# Patient Record
Sex: Male | Born: 1985 | Race: Black or African American | Hispanic: No | Marital: Single | Smoking: Light tobacco smoker
Health system: Southern US, Community
[De-identification: ages and names within clinical notes are randomized; demographics above are authoritative.]

---

## 2014-06-25 ENCOUNTER — Emergency Department (HOSPITAL_COMMUNITY): Payer: Self-pay | Admitting: Certified Registered Nurse Anesthetist

## 2014-06-25 ENCOUNTER — Observation Stay (HOSPITAL_COMMUNITY)
Admission: EM | Admit: 2014-06-25 | Discharge: 2014-06-26 | Disposition: A | Discharge status: Home / Self Care | Payer: Self-pay | Attending: Orthopedic Surgery | Admitting: Orthopedic Surgery

## 2014-06-25 ENCOUNTER — Encounter (HOSPITAL_COMMUNITY): Payer: Self-pay

## 2014-06-25 ENCOUNTER — Encounter (HOSPITAL_COMMUNITY)
Admission: EM | Disposition: A | Discharge status: Home / Self Care | Payer: Self-pay | Source: Home / Self Care | Attending: Emergency Medicine

## 2014-06-25 ENCOUNTER — Emergency Department (HOSPITAL_COMMUNITY): Payer: Self-pay

## 2014-06-25 DIAGNOSIS — S6990XA Unspecified injury of unspecified wrist, hand and finger(s), initial encounter: Secondary | ICD-10-CM | POA: Diagnosis present

## 2014-06-25 DIAGNOSIS — W503XXD Accidental bite by another person, subsequent encounter: Secondary | ICD-10-CM

## 2014-06-25 DIAGNOSIS — L089 Local infection of the skin and subcutaneous tissue, unspecified: Secondary | ICD-10-CM | POA: Diagnosis present

## 2014-06-25 DIAGNOSIS — S61452D Open bite of left hand, subsequent encounter: Secondary | ICD-10-CM

## 2014-06-25 DIAGNOSIS — S61452A Open bite of left hand, initial encounter: Principal | ICD-10-CM | POA: Insufficient documentation

## 2014-06-25 DIAGNOSIS — S6992XA Unspecified injury of left wrist, hand and finger(s), initial encounter: Secondary | ICD-10-CM

## 2014-06-25 DIAGNOSIS — Y929 Unspecified place or not applicable: Secondary | ICD-10-CM | POA: Insufficient documentation

## 2014-06-25 DIAGNOSIS — M009 Pyogenic arthritis, unspecified: Secondary | ICD-10-CM | POA: Insufficient documentation

## 2014-06-25 HISTORY — PX: I & D EXTREMITY: SHX5045

## 2014-06-25 LAB — CBC WITH DIFFERENTIAL/PLATELET
BASOS ABS: 0 10*3/uL (ref 0.0–0.1)
BASOS PCT: 0 % (ref 0–1)
EOS ABS: 0.5 10*3/uL (ref 0.0–0.7)
EOS PCT: 5 % (ref 0–5)
HEMATOCRIT: 42 % (ref 39.0–52.0)
HEMOGLOBIN: 14.2 g/dL (ref 13.0–17.0)
Lymphocytes Relative: 24 % (ref 12–46)
Lymphs Abs: 2.3 10*3/uL (ref 0.7–4.0)
MCH: 30.1 pg (ref 26.0–34.0)
MCHC: 33.8 g/dL (ref 30.0–36.0)
MCV: 89 fL (ref 78.0–100.0)
MONO ABS: 0.9 10*3/uL (ref 0.1–1.0)
MONOS PCT: 9 % (ref 3–12)
Neutro Abs: 6.1 10*3/uL (ref 1.7–7.7)
Neutrophils Relative %: 62 % (ref 43–77)
Platelets: 323 10*3/uL (ref 150–400)
RBC: 4.72 MIL/uL (ref 4.22–5.81)
RDW: 13.8 % (ref 11.5–15.5)
WBC: 9.8 10*3/uL (ref 4.0–10.5)

## 2014-06-25 LAB — I-STAT CHEM 8, ED
BUN: 10 mg/dL (ref 6–20)
CREATININE: 1.2 mg/dL (ref 0.61–1.24)
Calcium, Ion: 1.2 mmol/L (ref 1.12–1.23)
Chloride: 102 mmol/L (ref 101–111)
Glucose, Bld: 93 mg/dL (ref 70–99)
HCT: 48 % (ref 39.0–52.0)
HEMOGLOBIN: 16.3 g/dL (ref 13.0–17.0)
POTASSIUM: 3.9 mmol/L (ref 3.5–5.1)
SODIUM: 141 mmol/L (ref 135–145)
TCO2: 26 mmol/L (ref 0–100)

## 2014-06-25 LAB — SEDIMENTATION RATE: Sed Rate: 5 mm/hr (ref 0–16)

## 2014-06-25 LAB — C-REACTIVE PROTEIN: CRP: 1.2 mg/dL — ABNORMAL HIGH (ref <1.0)

## 2014-06-25 SURGERY — IRRIGATION AND DEBRIDEMENT EXTREMITY
Anesthesia: General | Site: Hand | Laterality: Left

## 2014-06-25 MED ORDER — HYDROCODONE-ACETAMINOPHEN 5-325 MG PO TABS
1.0000 | ORAL_TABLET | ORAL | Status: DC | PRN
  Administered 2014-06-25: 1 via ORAL
  Filled 2014-06-25: qty 1

## 2014-06-25 MED ORDER — PROMETHAZINE HCL 25 MG/ML IJ SOLN: 6.2500 mg | INTRAMUSCULAR | Status: DC | PRN

## 2014-06-25 MED ORDER — FENTANYL CITRATE (PF) 100 MCG/2ML IJ SOLN
25.0000 ug | INTRAMUSCULAR | Status: DC | PRN
  Administered 2014-06-25 (×2): 50 ug via INTRAVENOUS

## 2014-06-25 MED ORDER — MEPERIDINE HCL 50 MG/ML IJ SOLN
INTRAMUSCULAR | Status: AC
  Filled 2014-06-25: qty 1

## 2014-06-25 MED ORDER — PROPOFOL 10 MG/ML IV BOLUS
INTRAVENOUS | Status: AC
  Filled 2014-06-25: qty 20

## 2014-06-25 MED ORDER — DOCUSATE SODIUM 100 MG PO CAPS
100.0000 mg | ORAL_CAPSULE | Freq: Two times a day (BID) | ORAL | Status: DC
  Administered 2014-06-26 (×2): 100 mg via ORAL

## 2014-06-25 MED ORDER — FENTANYL CITRATE (PF) 100 MCG/2ML IJ SOLN
INTRAMUSCULAR | Status: AC
  Filled 2014-06-25: qty 2

## 2014-06-25 MED ORDER — 0.9 % SODIUM CHLORIDE (POUR BTL) OPTIME
TOPICAL | Status: DC | PRN
  Administered 2014-06-25: 1000 mL

## 2014-06-25 MED ORDER — KETOROLAC TROMETHAMINE 30 MG/ML IJ SOLN
INTRAMUSCULAR | Status: AC
  Filled 2014-06-25: qty 1

## 2014-06-25 MED ORDER — LIDOCAINE HCL (CARDIAC) 20 MG/ML IV SOLN
INTRAVENOUS | Status: DC | PRN
  Administered 2014-06-25: 100 mg via INTRAVENOUS

## 2014-06-25 MED ORDER — FENTANYL CITRATE (PF) 100 MCG/2ML IJ SOLN
INTRAMUSCULAR | Status: DC | PRN
  Administered 2014-06-25 (×6): 50 ug via INTRAVENOUS

## 2014-06-25 MED ORDER — PROPOFOL 10 MG/ML IV BOLUS
INTRAVENOUS | Status: DC | PRN
  Administered 2014-06-25: 200 mg via INTRAVENOUS

## 2014-06-25 MED ORDER — SODIUM CHLORIDE 0.9 % IV SOLN
3.0000 g | Freq: Four times a day (QID) | INTRAVENOUS | Status: DC
  Administered 2014-06-25 – 2014-06-26 (×4): 3 g via INTRAVENOUS
  Filled 2014-06-25 (×5): qty 3

## 2014-06-25 MED ORDER — LACTATED RINGERS IV SOLN
INTRAVENOUS | Status: DC | PRN
  Administered 2014-06-25: 15:00:00 via INTRAVENOUS

## 2014-06-25 MED ORDER — ONDANSETRON HCL 4 MG/2ML IJ SOLN
INTRAMUSCULAR | Status: DC | PRN
  Administered 2014-06-25: 4 mg via INTRAVENOUS

## 2014-06-25 MED ORDER — HYDROMORPHONE HCL 1 MG/ML IJ SOLN
INTRAMUSCULAR | Status: AC
  Filled 2014-06-25: qty 1

## 2014-06-25 MED ORDER — KETOROLAC TROMETHAMINE 30 MG/ML IJ SOLN
30.0000 mg | Freq: Once | INTRAMUSCULAR | Status: AC | PRN
  Administered 2014-06-25: 30 mg via INTRAVENOUS

## 2014-06-25 MED ORDER — ONDANSETRON HCL 4 MG/2ML IJ SOLN
INTRAMUSCULAR | Status: AC
  Filled 2014-06-25: qty 2

## 2014-06-25 MED ORDER — MIDAZOLAM HCL 5 MG/5ML IJ SOLN
INTRAMUSCULAR | Status: DC | PRN
  Administered 2014-06-25: 2 mg via INTRAVENOUS

## 2014-06-25 MED ORDER — BUPIVACAINE-EPINEPHRINE 0.5% -1:200000 IJ SOLN
INTRAMUSCULAR | Status: DC | PRN
  Administered 2014-06-25: 10 mL

## 2014-06-25 MED ORDER — LIDOCAINE HCL (CARDIAC) 20 MG/ML IV SOLN
INTRAVENOUS | Status: AC
  Filled 2014-06-25: qty 5

## 2014-06-25 MED ORDER — SODIUM CHLORIDE 0.9 % IV SOLN: 250.0000 mL | INTRAVENOUS | Status: DC | PRN

## 2014-06-25 MED ORDER — IBUPROFEN 600 MG PO TABS
600.0000 mg | ORAL_TABLET | Freq: Four times a day (QID) | ORAL | Status: DC
  Administered 2014-06-26 (×4): 600 mg via ORAL
  Filled 2014-06-25 (×3): qty 1
  Filled 2014-06-25: qty 3
  Filled 2014-06-25 (×4): qty 1

## 2014-06-25 MED ORDER — HYDROCODONE-ACETAMINOPHEN 7.5-325 MG PO TABS: 1.0000 | ORAL_TABLET | Freq: Once | ORAL | Status: DC | PRN

## 2014-06-25 MED ORDER — BACITRACIN ZINC 500 UNIT/GM EX OINT
TOPICAL_OINTMENT | CUTANEOUS | Status: AC
Start: 2014-06-25 — End: 2014-06-26
  Filled 2014-06-25: qty 4.5

## 2014-06-25 MED ORDER — SODIUM CHLORIDE 0.9 % IJ SOLN: 3.0000 mL | INTRAMUSCULAR | Status: DC | PRN

## 2014-06-25 MED ORDER — MORPHINE SULFATE 4 MG/ML IJ SOLN
4.0000 mg | Freq: Once | INTRAMUSCULAR | Status: AC
  Administered 2014-06-25: 4 mg via INTRAVENOUS
  Filled 2014-06-25: qty 1

## 2014-06-25 MED ORDER — BUPIVACAINE-EPINEPHRINE (PF) 0.5% -1:200000 IJ SOLN
INTRAMUSCULAR | Status: AC
  Filled 2014-06-25: qty 30

## 2014-06-25 MED ORDER — HYDROMORPHONE HCL 1 MG/ML IJ SOLN
0.2500 mg | INTRAMUSCULAR | Status: DC | PRN
  Administered 2014-06-25 (×3): 0.5 mg via INTRAVENOUS

## 2014-06-25 MED ORDER — LACTATED RINGERS IV SOLN
INTRAVENOUS | Status: DC
  Administered 2014-06-25: 125 mL/h via INTRAVENOUS

## 2014-06-25 MED ORDER — SODIUM CHLORIDE 0.9 % IV SOLN
INTRAVENOUS | Status: AC
  Filled 2014-06-25: qty 1.5

## 2014-06-25 MED ORDER — MEPERIDINE HCL 50 MG/ML IJ SOLN
6.2500 mg | INTRAMUSCULAR | Status: DC | PRN
  Administered 2014-06-25 (×2): 12.5 mg via INTRAVENOUS

## 2014-06-25 MED ORDER — TETANUS-DIPHTH-ACELL PERTUSSIS 5-2.5-18.5 LF-MCG/0.5 IM SUSP
0.5000 mL | Freq: Once | INTRAMUSCULAR | Status: DC
  Filled 2014-06-25 (×2): qty 0.5

## 2014-06-25 MED ORDER — OXYCODONE-ACETAMINOPHEN 5-325 MG PO TABS: 1.0000 | ORAL_TABLET | ORAL | Status: AC | PRN

## 2014-06-25 MED ORDER — OXYCODONE-ACETAMINOPHEN 5-325 MG PO TABS
1.0000 | ORAL_TABLET | ORAL | Status: DC | PRN
  Administered 2014-06-26: 2 via ORAL
  Administered 2014-06-26 (×3): 1 via ORAL
  Filled 2014-06-25: qty 2
  Filled 2014-06-25 (×3): qty 1

## 2014-06-25 MED ORDER — SODIUM CHLORIDE 0.9 % IV SOLN
3.0000 g | INTRAVENOUS | Status: DC | PRN
  Administered 2014-06-25: 1.5 g via INTRAVENOUS

## 2014-06-25 MED ORDER — PHENYLEPHRINE HCL 10 MG/ML IJ SOLN
INTRAMUSCULAR | Status: DC | PRN
  Administered 2014-06-25: 80 ug via INTRAVENOUS

## 2014-06-25 MED ORDER — SODIUM CHLORIDE 0.9 % IJ SOLN: 3.0000 mL | Freq: Two times a day (BID) | INTRAMUSCULAR | Status: DC

## 2014-06-25 MED ORDER — MIDAZOLAM HCL 2 MG/2ML IJ SOLN
INTRAMUSCULAR | Status: AC
  Filled 2014-06-25: qty 2

## 2014-06-25 MED ORDER — AMOXICILLIN-POT CLAVULANATE 875-125 MG PO TABS: 1.0000 | ORAL_TABLET | Freq: Two times a day (BID) | ORAL | Status: AC

## 2014-06-25 MED ORDER — FENTANYL CITRATE (PF) 250 MCG/5ML IJ SOLN
INTRAMUSCULAR | Status: AC
  Filled 2014-06-25: qty 5

## 2014-06-25 MED ORDER — MORPHINE SULFATE 2 MG/ML IJ SOLN
1.0000 mg | INTRAMUSCULAR | Status: DC | PRN
  Administered 2014-06-25 (×2): 1 mg via INTRAVENOUS
  Filled 2014-06-25 (×2): qty 1

## 2014-06-25 MED ORDER — IBUPROFEN 200 MG PO TABS: 800.0000 mg | ORAL_TABLET | Freq: Three times a day (TID) | ORAL | Status: AC | PRN

## 2014-06-25 SURGICAL SUPPLY — 52 items
BANDAGE COBAN STERILE 2 (GAUZE/BANDAGES/DRESSINGS) IMPLANT
BLADE AVERAGE 25MMX9MM (BLADE)
BLADE AVERAGE 25X9 (BLADE) IMPLANT
BLADE MINI RND TIP GREEN BEAV (BLADE) IMPLANT
BLADE OSCILLATING/SAGITTAL (BLADE)
BLADE SURG 15 STRL LF DISP TIS (BLADE) ×1 IMPLANT
BLADE SURG 15 STRL SS (BLADE) ×2
BLADE SW THK.38XMED LNG THN (BLADE) IMPLANT
BNDG COHESIVE 4X5 TAN STRL (GAUZE/BANDAGES/DRESSINGS) ×3 IMPLANT
BNDG CONFORM 2 STRL LF (GAUZE/BANDAGES/DRESSINGS) IMPLANT
BNDG CONFORM 3 STRL LF (GAUZE/BANDAGES/DRESSINGS) ×3 IMPLANT
BNDG ESMARK 4X9 LF (GAUZE/BANDAGES/DRESSINGS) ×3 IMPLANT
BNDG GAUZE ELAST 4 BULKY (GAUZE/BANDAGES/DRESSINGS) ×3 IMPLANT
CHLORAPREP W/TINT 26ML (MISCELLANEOUS) ×3 IMPLANT
CORDS BIPOLAR (ELECTRODE) ×3 IMPLANT
DRAPE C-ARM 42X120 X-RAY (DRAPES) ×3 IMPLANT
DRAPE SHEET LG 3/4 BI-LAMINATE (DRAPES) ×3 IMPLANT
DRAPE SURG 17X11 SM STRL (DRAPES) ×3 IMPLANT
DRSG ADAPTIC 3X8 NADH LF (GAUZE/BANDAGES/DRESSINGS) ×3 IMPLANT
DRSG EMULSION OIL 3X3 NADH (GAUZE/BANDAGES/DRESSINGS) ×3 IMPLANT
ELECT REM PT RETURN 9FT ADLT (ELECTROSURGICAL) ×3
ELECTRODE REM PT RTRN 9FT ADLT (ELECTROSURGICAL) ×1 IMPLANT
GAUZE SPONGE 2X2 8PLY STRL LF (GAUZE/BANDAGES/DRESSINGS) ×1 IMPLANT
GAUZE SPONGE 4X4 12PLY STRL (GAUZE/BANDAGES/DRESSINGS) ×3 IMPLANT
GAUZE XEROFORM 5X9 LF (GAUZE/BANDAGES/DRESSINGS) IMPLANT
GLOVE BIO SURGEON STRL SZ7.5 (GLOVE) ×3 IMPLANT
GLOVE BIOGEL PI IND STRL 8 (GLOVE) ×1 IMPLANT
GLOVE BIOGEL PI INDICATOR 8 (GLOVE) ×2
GOWN STRL REUS W/ TWL XL LVL3 (GOWN DISPOSABLE) ×1 IMPLANT
GOWN STRL REUS W/TWL XL LVL3 (GOWN DISPOSABLE) ×2
KWIRE 4.0 X .045IN (WIRE) IMPLANT
KWIRE 4.0 X .062IN (WIRE) IMPLANT
NEEDLE HYPO 25X1 1.5 SAFETY (NEEDLE) IMPLANT
NS IRRIG 500ML POUR BTL (IV SOLUTION) ×3 IMPLANT
PACK ORTHO EXTREMITY (CUSTOM PROCEDURE TRAY) ×3 IMPLANT
PAD CAST 4YDX4 CTTN HI CHSV (CAST SUPPLIES) ×1 IMPLANT
PADDING CAST ABS 4INX4YD NS (CAST SUPPLIES) ×2
PADDING CAST ABS COTTON 4X4 ST (CAST SUPPLIES) ×1 IMPLANT
PADDING CAST COTTON 4X4 STRL (CAST SUPPLIES) ×2
PENCIL BUTTON HOLSTER BLD 10FT (ELECTRODE) IMPLANT
SPONGE GAUZE 2X2 STER 10/PKG (GAUZE/BANDAGES/DRESSINGS) ×2
STOCKINETTE 4X48 STRL (DRAPES) IMPLANT
SUCTION FRAZIER TIP 10 FR DISP (SUCTIONS) ×3 IMPLANT
SUT ETHILON 4 0 PS 2 18 (SUTURE) ×3 IMPLANT
SUT MNCRL AB 4-0 PS2 18 (SUTURE) IMPLANT
SUT VIC AB 4-0 P-3 18XBRD (SUTURE) IMPLANT
SUT VIC AB 4-0 P3 18 (SUTURE)
SUT VICRYL RAPIDE 4/0 PS 2 (SUTURE) ×3 IMPLANT
SYR BULB 3OZ (MISCELLANEOUS) ×3 IMPLANT
SYRINGE 10CC LL (SYRINGE) IMPLANT
TOWEL OR 17X24 6PK STRL BLUE (TOWEL DISPOSABLE) ×3 IMPLANT
UNDERPAD 30X30 INCONTINENT (UNDERPADS AND DIAPERS) ×3 IMPLANT

## 2014-06-25 NOTE — Progress Notes (Signed)
pacu nursing:  3 prescriptions w/chart to [facility] along with one bag of clothing and copy of yellow valuables sheet w/key. Attempted to give update to patients girlfriend at the cell phone number given to me by patient.  No answer.
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
## 2014-06-25 NOTE — Anesthesia Procedure Notes (Signed)
Procedure Name: LMA Insertion Date/Time: 06/25/2014 3:33 PM Performed by: Epimenio SarinJARVELA, Vanette Noguchi R Pre-anesthesia Checklist: Patient identified, Emergency Drugs available, Suction available, Patient being monitored and Timeout performed Patient Re-evaluated:Patient Re-evaluated prior to inductionOxygen Delivery Method: Circle system utilized Preoxygenation: Pre-oxygenation with 100% oxygen Intubation Type: IV induction Ventilation: Mask ventilation without difficulty LMA: LMA with gastric port inserted LMA Size: 4.0 Number of attempts: 1 Dental Injury: Teeth and Oropharynx as per pre-operative assessment

## 2014-06-25 NOTE — Anesthesia Preprocedure Evaluation (Addendum)
Anesthesia Evaluation  Patient identified by MRN, date of birth, ID band Patient awake    Reviewed: Allergy & Precautions, NPO status , Patient's Chart, lab work & pertinent test results  Airway Mallampati: I  TM Distance: >3 FB Neck ROM: Full    Dental no notable dental hx. (+) Teeth Intact, Chipped,    Pulmonary Recent URI ,  breath sounds clear to auscultation  Pulmonary exam normal       Cardiovascular negative cardio ROS  Rhythm:Regular Rate:Normal     Neuro/Psych negative neurological ROS  negative psych ROS   GI/Hepatic negative GI ROS, Neg liver ROS,   Endo/Other  negative endocrine ROS  Renal/GU negative Renal ROS  negative genitourinary   Musculoskeletal Laceration right 3rd MCP with swelling and erythema   Abdominal   Peds  Hematology negative hematology ROS (+)   Anesthesia Other Findings   Reproductive/Obstetrics                           Anesthesia Physical Anesthesia Plan  ASA: I and emergent  Anesthesia Plan: General   Post-op Pain Management:    Induction:   Airway Management Planned: LMA  Additional Equipment:   Intra-op Plan:   Post-operative Plan: Extubation in OR  Informed Consent: I have reviewed the patients History and Physical, chart, labs and discussed the procedure including the risks, benefits and alternatives for the proposed anesthesia with the patient or authorized representative who has indicated his/her understanding and acceptance.   Dental advisory given  Plan Discussed with: CRNA, Anesthesiologist and Surgeon  Anesthesia Plan Comments:        Anesthesia Quick Evaluation

## 2014-06-25 NOTE — Anesthesia Postprocedure Evaluation (Signed)
  Anesthesia Post-op Note  Patient: Devon Munoz  Procedure(s) Performed: Procedure(s): IRRIGATION AND DEBRIDEMENT LEFT HAND (Left)  Patient Location: PACU  Anesthesia Type:General  Level of Consciousness: awake, alert  and oriented  Airway and Oxygen Therapy: Patient Spontanous Breathing  Post-op Pain: none  Post-op Assessment: Post-op Vital signs reviewed, Patient's Cardiovascular Status Stable, Respiratory Function Stable, Patent Airway, No signs of Nausea or vomiting and Pain level controlled  Post-op Vital Signs: Reviewed and stable  Last Vitals:  Filed Vitals:   06/25/14 1341  BP: 128/72  Pulse: 69  Temp: 36.7 C  Resp: 16    Complications: No apparent anesthesia complications
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
## 2014-06-25 NOTE — Transfer of Care (Signed)
Immediate Anesthesia Transfer of Care Note  Patient: Devon Munoz  Procedure(s) Performed: Procedure(s): IRRIGATION AND DEBRIDEMENT LEFT HAND (Left)  Patient Location: PACU  Anesthesia Type:General  Level of Consciousness:  sedated, patient cooperative and responds to stimulation  Airway & Oxygen Therapy:Patient Spontanous Breathing and Patient connected to face mask oxgen  Post-op Assessment:  Report given to PACU RN and Post -op Vital signs reviewed and stable  Post vital signs:  Reviewed and stable  Last Vitals:  Filed Vitals:   06/25/14 1341  BP: 128/72  Pulse: 69  Temp: 36.7 C  Resp: 16    Complications: No apparent anesthesia complications
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
## 2014-06-26 ENCOUNTER — Encounter (HOSPITAL_COMMUNITY): Payer: Self-pay | Admitting: Orthopedic Surgery

## 2014-06-26 LAB — CBC
HCT: 38.5 % — ABNORMAL LOW (ref 39.0–52.0)
Hemoglobin: 12.4 g/dL — ABNORMAL LOW (ref 13.0–17.0)
MCH: 28.4 pg (ref 26.0–34.0)
MCHC: 32.2 g/dL (ref 30.0–36.0)
MCV: 88.3 fL (ref 78.0–100.0)
Platelets: 282 10*3/uL (ref 150–400)
RBC: 4.36 MIL/uL (ref 4.22–5.81)
RDW: 13.6 % (ref 11.5–15.5)
WBC: 10.5 10*3/uL (ref 4.0–10.5)

## 2014-06-26 MED ORDER — TETANUS-DIPHTH-ACELL PERTUSSIS 5-2.5-18.5 LF-MCG/0.5 IM SUSP
0.5000 mL | Freq: Once | INTRAMUSCULAR | Status: AC
  Administered 2014-06-26: 0.5 mL via INTRAMUSCULAR
  Filled 2014-06-26: qty 0.5

## 2014-06-26 NOTE — Clinical Social Work Note (Signed)
Clinical Social Work Assessment  Patient Details  Name: Devon Munoz MRN: [id number] Date of Birth: Jun 18, 1985  Date of referral:  06/26/14               Reason for consult:  Housing Concerns/Homelessness                Permission sought to share information with:  Case Manager Permission granted to share information::  Yes, Verbal Permission Granted  Name::        Agency::     Relationship::     Contact Information:     Housing/Transportation Living arrangements for the past 2 months:  No permanent address, Homeless (staying with friends) Source of Information:  Patient Patient Interpreter Needed:  None Criminal Activity/Legal Involvement Pertinent to Current Situation/Hospitalization:  No - Comment as needed Significant Relationships:  Significant Other Lives with:  Self, Significant Other Do you feel safe going back to the place where you live?  Yes Need for family participation in patient care:  No (Coment)  Care giving concerns:  Patient has no medical doctor at this time for follow up regarding infection. Pt referred to nurse case manager for follow up.    Social Worker assessment / plan:  CSW met with pt at bedside to complete psychosocial assessment. CSW introduced self and explained role. Patient shared that he was planning to move into a new place in [place] with his girlfriend who is also pregnant however due to infection plans fell through. Pt shared that his girlfriend is staying with her parents. Patient shared he is unable to stay with friends or relatives and is homeless. CSW provided patient with shelter information as well as homeless resources including the [employer]. CSW and pt discussed the items needed for the shelter including ID. [facility]. Pt also plans [facility] into [facility] if there is nothing available at [facility]. CSW provided patietn with bus passes to assist with going  to seek shelter and follow up dr. Thomasene Lot.    Employment status:  Unemployed Forensic scientist:  Self Pay (Medicaid Pending) PT Recommendations:    Information / Referral to community resources:  Shelter  Patient/Family's Response to care:  Pt thanked csw for concern and support. Pt states is motivated to get back on his feet and be abel to obtain housing for him and his girlfriend. Even though patient girlfriend is staying with her parents, pt reports girlfriend is still a strong support.   Patient/Family's Understanding of and Emotional Response to Diagnosis, Current Treatment, and Prognosis:  Patient appeared and verbalized understanding of current treatment plan for his hand. Pt plans to discuss plans further with nurse case management regarding medical follow up. Patient verbalized understanding of community resources to assist with pt basic needs.   Emotional Assessment Appearance:  Appears stated age, Well-Groomed, Developmentally appropriate Attitude/Demeanor/Rapport:   (calm, pleasant, coopeartive, appreciative) Affect (typically observed):  Accepting, Calm, Quiet Orientation:  Oriented to Self, Oriented to Place, Oriented to  Time, Oriented to Situation Alcohol / Substance use:    Psych involvement (Current and /or in the community):  No (Comment)  Discharge Needs  Concerns to be addressed:  Homelessness Readmission within the last 30 days:  No Current discharge risk:  None Barriers to Discharge:  No Barriers Identified   Devon Munoz, [place], LCSW 06/26/2014, 10:21 AM Covering CSW [phone]
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
## 2014-06-29 LAB — CULTURE, ROUTINE-ABSCESS
Culture: NO GROWTH
Gram Stain: NONE SEEN

## 2014-07-01 LAB — ANAEROBIC CULTURE: GRAM STAIN: NONE SEEN
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
